# Patient Record
Sex: Female | Born: 1955 | Race: White | Hispanic: No | Marital: Married | Smoking: Current every day smoker
Health system: Southern US, Community
[De-identification: ages and names within clinical notes are randomized; demographics above are authoritative.]

## PROBLEM LIST (undated history)

## (undated) DIAGNOSIS — I10 Essential (primary) hypertension: Secondary | ICD-10-CM

## (undated) DIAGNOSIS — C801 Malignant (primary) neoplasm, unspecified: Secondary | ICD-10-CM

## (undated) HISTORY — DX: Malignant (primary) neoplasm, unspecified: C80.1

## (undated) HISTORY — DX: Essential (primary) hypertension: I10

---

## 1994-07-27 DIAGNOSIS — I10 Essential (primary) hypertension: Secondary | ICD-10-CM

## 1994-07-27 HISTORY — DX: Essential (primary) hypertension: I10

## 1995-07-28 HISTORY — PX: CHOLECYSTECTOMY: SHX55

## 1997-07-27 HISTORY — PX: INCONTINENCE SURGERY: SHX676

## 1997-07-27 HISTORY — PX: VAGINA SURGERY: SHX829

## 2004-12-02 ENCOUNTER — Ambulatory Visit: Payer: Self-pay | Admitting: Urology

## 2006-02-18 ENCOUNTER — Ambulatory Visit: Payer: Self-pay | Admitting: Unknown Physician Specialty

## 2006-07-29 ENCOUNTER — Observation Stay: Payer: Self-pay | Admitting: Internal Medicine

## 2006-07-29 ENCOUNTER — Other Ambulatory Visit: Payer: Self-pay

## 2008-04-27 ENCOUNTER — Ambulatory Visit: Payer: Self-pay | Admitting: Family Medicine

## 2009-01-02 ENCOUNTER — Ambulatory Visit: Payer: Self-pay | Admitting: Internal Medicine

## 2009-01-03 ENCOUNTER — Ambulatory Visit: Payer: Self-pay | Admitting: Internal Medicine

## 2009-01-07 ENCOUNTER — Emergency Department: Payer: Self-pay | Admitting: Emergency Medicine

## 2009-01-21 ENCOUNTER — Ambulatory Visit: Payer: Self-pay | Admitting: Family Medicine

## 2009-01-22 ENCOUNTER — Ambulatory Visit: Payer: Self-pay | Admitting: Family Medicine

## 2009-02-14 ENCOUNTER — Ambulatory Visit: Payer: Self-pay | Admitting: Gastroenterology

## 2009-04-01 ENCOUNTER — Emergency Department: Payer: Self-pay | Admitting: Emergency Medicine

## 2009-10-13 ENCOUNTER — Emergency Department: Payer: Self-pay | Admitting: Emergency Medicine

## 2010-02-06 ENCOUNTER — Ambulatory Visit: Payer: Self-pay | Admitting: Unknown Physician Specialty

## 2010-02-12 ENCOUNTER — Ambulatory Visit: Payer: Self-pay | Admitting: Unknown Physician Specialty

## 2010-03-07 ENCOUNTER — Ambulatory Visit: Payer: Self-pay | Admitting: General Surgery

## 2010-03-10 LAB — PATHOLOGY REPORT

## 2011-05-04 ENCOUNTER — Ambulatory Visit: Payer: Self-pay | Admitting: Family Medicine

## 2011-05-28 ENCOUNTER — Ambulatory Visit: Payer: Self-pay | Admitting: Family Medicine

## 2013-02-09 ENCOUNTER — Encounter: Payer: Self-pay | Admitting: *Deleted

## 2016-09-21 ENCOUNTER — Other Ambulatory Visit: Payer: Self-pay | Admitting: Family Medicine

## 2016-09-21 DIAGNOSIS — Z1231 Encounter for screening mammogram for malignant neoplasm of breast: Secondary | ICD-10-CM

## 2016-09-22 ENCOUNTER — Ambulatory Visit
Admission: RE | Admit: 2016-09-22 | Discharge: 2016-09-22 | Disposition: A | Discharge status: Home / Self Care | Payer: 59 | Source: Ambulatory Visit | Attending: Family Medicine | Admitting: Family Medicine

## 2016-09-22 ENCOUNTER — Encounter (INDEPENDENT_AMBULATORY_CARE_PROVIDER_SITE_OTHER): Payer: Self-pay

## 2016-09-22 DIAGNOSIS — Z1231 Encounter for screening mammogram for malignant neoplasm of breast: Secondary | ICD-10-CM | POA: Diagnosis not present

## 2019-02-27 ENCOUNTER — Encounter: Payer: Self-pay | Admitting: General Surgery

## 2019-04-22 ENCOUNTER — Emergency Department
Admission: EM | Admit: 2019-04-22 | Discharge: 2019-04-22 | Disposition: A | Discharge status: Home / Self Care | Payer: 59 | Attending: Emergency Medicine | Admitting: Emergency Medicine

## 2019-04-22 ENCOUNTER — Emergency Department: Payer: 59

## 2019-04-22 DIAGNOSIS — M25552 Pain in left hip: Secondary | ICD-10-CM | POA: Insufficient documentation

## 2019-04-22 DIAGNOSIS — N3000 Acute cystitis without hematuria: Secondary | ICD-10-CM

## 2019-04-22 DIAGNOSIS — M25551 Pain in right hip: Secondary | ICD-10-CM | POA: Diagnosis present

## 2019-04-22 DIAGNOSIS — F17210 Nicotine dependence, cigarettes, uncomplicated: Secondary | ICD-10-CM | POA: Diagnosis not present

## 2019-04-22 DIAGNOSIS — I10 Essential (primary) hypertension: Secondary | ICD-10-CM | POA: Diagnosis not present

## 2019-04-22 DIAGNOSIS — N3090 Cystitis, unspecified without hematuria: Secondary | ICD-10-CM | POA: Insufficient documentation

## 2019-04-22 LAB — URINALYSIS, COMPLETE (UACMP) WITH MICROSCOPIC
Bilirubin Urine: NEGATIVE
Glucose, UA: NEGATIVE mg/dL
Hgb urine dipstick: NEGATIVE
Ketones, ur: NEGATIVE mg/dL
Nitrite: NEGATIVE
Protein, ur: NEGATIVE mg/dL
Specific Gravity, Urine: 1.016 (ref 1.005–1.030)
pH: 6 (ref 5.0–8.0)

## 2019-04-22 MED ORDER — NAPROXEN 500 MG PO TABS
500.0000 mg | ORAL_TABLET | Freq: Once | ORAL | Status: AC
  Administered 2019-04-22: 07:00:00 500 mg via ORAL
  Filled 2019-04-22: qty 1

## 2019-04-22 MED ORDER — LIDOCAINE 5 % EX PTCH
1.0000 | MEDICATED_PATCH | CUTANEOUS | Status: DC
  Administered 2019-04-22: 07:00:00 1 via TRANSDERMAL
  Filled 2019-04-22: qty 1

## 2019-04-22 MED ORDER — LIDOCAINE 5 % EX PTCH: 1.0000 | MEDICATED_PATCH | Freq: Two times a day (BID) | CUTANEOUS | 0 refills | Status: AC

## 2019-04-22 MED ORDER — CEPHALEXIN 500 MG PO CAPS: 500.0000 mg | ORAL_CAPSULE | Freq: Two times a day (BID) | ORAL | 0 refills | Status: AC

## 2019-04-22 NOTE — ED Notes (Signed)
Patient transported to X-ray at this time 

## 2019-04-22 NOTE — ED Provider Notes (Signed)
8:06 AM XR imaging negative.  Will proceed with discharge as planned, given prescription for antibiotics for UTI.  Advised PCP follow-up, and given return precautions.   Lilia Pro., MD 04/22/19 [phone]

## 2019-04-22 NOTE — ED Provider Notes (Signed)
[facility] Emergency Department Provider Note   ____________________________________________   First MD Initiated Contact with Patient 04/22/19 [phone]     (approximate)  I have reviewed the triage vital signs and the nursing notes.   HISTORY  Chief Complaint Hip Pain (Bilateral)    HPI Kiara Ray is a 63 y.o. female with past medical history of hypertension who presents to the ED complaining of back and hip pain.  Patient reports she has had 3 weeks of gradually worsening pain that seems to radiate around from her lower back into her bilateral hips.  Pain is exacerbated by movement, particularly when she goes to rise from a seated to a standing position.  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  Pain continued when she woke up this morning and was not alleviated by Tylenol.  She has seen her PCP for this problem, had negative x-rays of her sacrum and was referred to orthopedics.  She denies any numbness or weakness in her lower extremities, has not had any saddle anesthesia, and denies any urinary retention/incontinence.  She has not had any fevers, abdominal pain, dysuria, hematuria, or flank pain.        Past Medical History:  Diagnosis Date  . Cancer ([place])   . Hypertension 1996    There are no active problems to display for this patient.   Past Surgical History:  Procedure Laterality Date  . CESAREAN SECTION  [id number]  . CHOLECYSTECTOMY  1997  . INCONTINENCE SURGERY  1999  . [place]    Prior to Admission medications   Medication Sig Start Date End Date Taking? Authorizing Provider  cephALEXin (KEFLEX) 500 MG capsule Take 1 capsule (500 mg total) by mouth 2 (two) times daily for 7 days. 04/22/19 04/29/19  Blake Divine, MD  lidocaine (LIDODERM) 5 % Place 1 patch onto the skin every 12 (twelve) hours. Remove &  Discard patch within 12 hours or as directed by MD 04/22/19 04/21/20  Blake Divine, MD    Allergies Patient has no known allergies.  Family History  Problem Relation Age of Onset  . Breast cancer Paternal Aunt   . Ovarian cancer Maternal Aunt   . Breast cancer Maternal Aunt     Social History Social History   Tobacco Use  . Smoking status: Current Every Day Smoker    Packs/day: 1.00  . Smokeless tobacco: Never Used  Substance Use Topics  . Alcohol use: No  . Drug use: Not on file    Review of Systems  Constitutional: No fever/chills Eyes: No visual changes. ENT: No sore throat. Cardiovascular: Denies chest pain. Respiratory: Denies shortness of breath. Gastrointestinal: No abdominal pain.  No nausea, no vomiting.  No diarrhea.  No constipation. Genitourinary: Negative for dysuria. Musculoskeletal: Positive for back pain. Skin: Negative for rash. Neurological: Negative for headaches, focal weakness or numbness.  ____________________________________________   PHYSICAL EXAM:  VITAL SIGNS: ED Triage Vitals  Enc Vitals Group     BP --      Pulse --      Resp --      Temp 04/22/19 0605 98.2 F (36.8 C)     Temp Source 04/22/19 0605 Oral     SpO2 --      Weight 04/22/19 0606 168 lb (76.2 kg)     Height 04/22/19 0606 5\' 4"  (1.626  m)     Head Circumference --      Peak Flow --      Pain Score 04/22/19 0606 8     Pain Loc --      Pain Edu? --      Excl. in [place]? --     Constitutional: Alert and oriented. Eyes: Conjunctivae are normal. Head: Atraumatic. Nose: No congestion/rhinnorhea. Mouth/Throat: Mucous membranes are moist. Neck: Normal ROM Cardiovascular: Normal rate, regular rhythm. Grossly normal heart sounds. Respiratory: Normal respiratory effort.  No retractions. Lungs CTAB. Gastrointestinal: Soft and nontender. No distention.  No CVA tenderness bilaterally. Genitourinary: deferred Musculoskeletal: No lower extremity tenderness nor edema.  No  midline thoracic or lumbar spinal tenderness. Neurologic:  Normal speech and language. No gross focal neurologic deficits are appreciated.  Strength 5 out of 5 in bilateral lower extremities. Skin:  Skin is warm, dry and intact. No rash noted. Psychiatric: Mood and affect are normal. Speech and behavior are normal.  ____________________________________________   LABS (all labs ordered are listed, but only abnormal results are displayed)  Labs Reviewed  URINALYSIS, COMPLETE (UACMP) WITH MICROSCOPIC - Abnormal; Notable for the following components:      Result Value   Color, Urine YELLOW (*)    APPearance CLOUDY (*)    Leukocytes,Ua SMALL (*)    Bacteria, UA MANY (*)    All other components within normal limits  URINE CULTURE     PROCEDURES  Procedure(s) performed (including Critical Care):  Procedures   ____________________________________________   INITIAL IMPRESSION / ASSESSMENT AND PLAN / ED COURSE       63 year old female presenting to the ED with bilateral lower back pain rating around into hips bilaterally, exacerbated when rising from seated to standing position.  She is neurovascularly intact to her bilateral lower extremities, no findings concerning for cauda equina.  She denies any traumatic injury, will screen bilateral hip x-ray as this appears to be the location of her most severe pain.  Given radiation of pain into her groin, will check UA.  No abdominal tenderness concerning for intra-abdominal pathology.  Prior imaging of her abdomen showed no evidence of aortic aneurysm.  UA consistent with infection, will start patient on Keflex.  Patient turned over to Dr. Joan Mayans pending x-ray results.      ____________________________________________   FINAL CLINICAL IMPRESSION(S) / ED DIAGNOSES  Final diagnoses:  Bilateral hip pain  Acute cystitis without hematuria     ED Discharge Orders         Ordered    cephALEXin (KEFLEX) 500 MG capsule  2 times daily      04/22/19 0647    lidocaine (LIDODERM) 5 %  Every 12 hours     04/22/19 [id number]           Note:  This document was prepared using Dragon voice recognition software and may include unintentional dictation errors.   Blake Divine, MD 04/22/19 [phone]

## 2019-04-22 NOTE — ED Triage Notes (Signed)
Patient presents to ED from home with complaints of bilateral hip pain. Has had it for a couple of weeks but today is much worse and can "barely walk." Describes pain as top of tailbone all the way around on both sides. Has an ortho appt but states she can't wait that long.

## 2019-04-24 LAB — URINE CULTURE: Culture: >=100000 — AB

## 2019-08-18 ENCOUNTER — Ambulatory Visit: Payer: BC Managed Care – PPO | Attending: Internal Medicine

## 2019-08-18 DIAGNOSIS — Z20822 Contact with and (suspected) exposure to covid-19: Secondary | ICD-10-CM | POA: Insufficient documentation

## 2019-08-19 LAB — NOVEL CORONAVIRUS, NAA: SARS-CoV-2, NAA: NOT DETECTED

## 2019-11-18 ENCOUNTER — Ambulatory Visit: Payer: Self-pay | Attending: Internal Medicine

## 2019-11-18 DIAGNOSIS — Z23 Encounter for immunization: Secondary | ICD-10-CM

## 2019-11-18 NOTE — Progress Notes (Signed)
   Covid-19 Vaccination Clinic  Name:  Kiara Ray    MRN: [id number] DOB: 1955-11-27  11/18/2019  Kiara Ray was observed post Covid-19 immunization for 30 minutes based on pre-vaccination screening without incident. She was provided with Vaccine Information Sheet and instruction to access the V-Safe system.   Kiara Ray was instructed to call 911 with any severe reactions post vaccine: Marland Kitchen Difficulty breathing  . Swelling of face and throat  . A fast heartbeat  . A bad rash all over body  . Dizziness and weakness   Immunizations Administered    Name Date Dose VIS Date Route   Pfizer COVID-19 Vaccine 11/18/2019  8:21 AM 0.3 mL 09/20/2018 Intramuscular   Manufacturer: [place]   Lot: [id number]   [place]: [id number]

## 2019-12-12 ENCOUNTER — Ambulatory Visit: Payer: Self-pay | Attending: Internal Medicine

## 2019-12-12 DIAGNOSIS — Z23 Encounter for immunization: Secondary | ICD-10-CM

## 2019-12-12 NOTE — Progress Notes (Signed)
   Covid-19 Vaccination Clinic  Name:  Kyara Bartman    MRN: [id number] DOB: [birth year]  12/12/2019  Ms. Jiles was observed post Covid-19 immunization for 15 minutes without incident. She was provided with Vaccine Information Sheet and instruction to access the V-Safe system.   Ms. Cacchione was instructed to call 911 with any severe reactions post vaccine: Marland Kitchen Difficulty breathing  . Swelling of face and throat  . A fast heartbeat  . A bad rash all over body  . Dizziness and weakness   Immunizations Administered    Name Date Dose VIS Date Route   Pfizer COVID-19 Vaccine 12/12/2019  3:13 PM 0.3 mL 09/20/2018 Intramuscular   Manufacturer: [place]   Lot: [id number]   [place]: [id number]

## 2020-09-28 IMAGING — CR DG HIP (WITH OR WITHOUT PELVIS) 2-3V*R*
1 series · 3 of 3 positions shown · non-contrast
Comparison: None.

CLINICAL DATA: Bilateral hip pain.

EXAM:
DG HIP (WITH OR WITHOUT PELVIS) 2-3V RIGHT

[Series 1: dg hip unilat w or w/o pelvis 2-3 views  · non-contrast · 0.14mm/px · 3 of 3 slices shown]
[im 1/3]
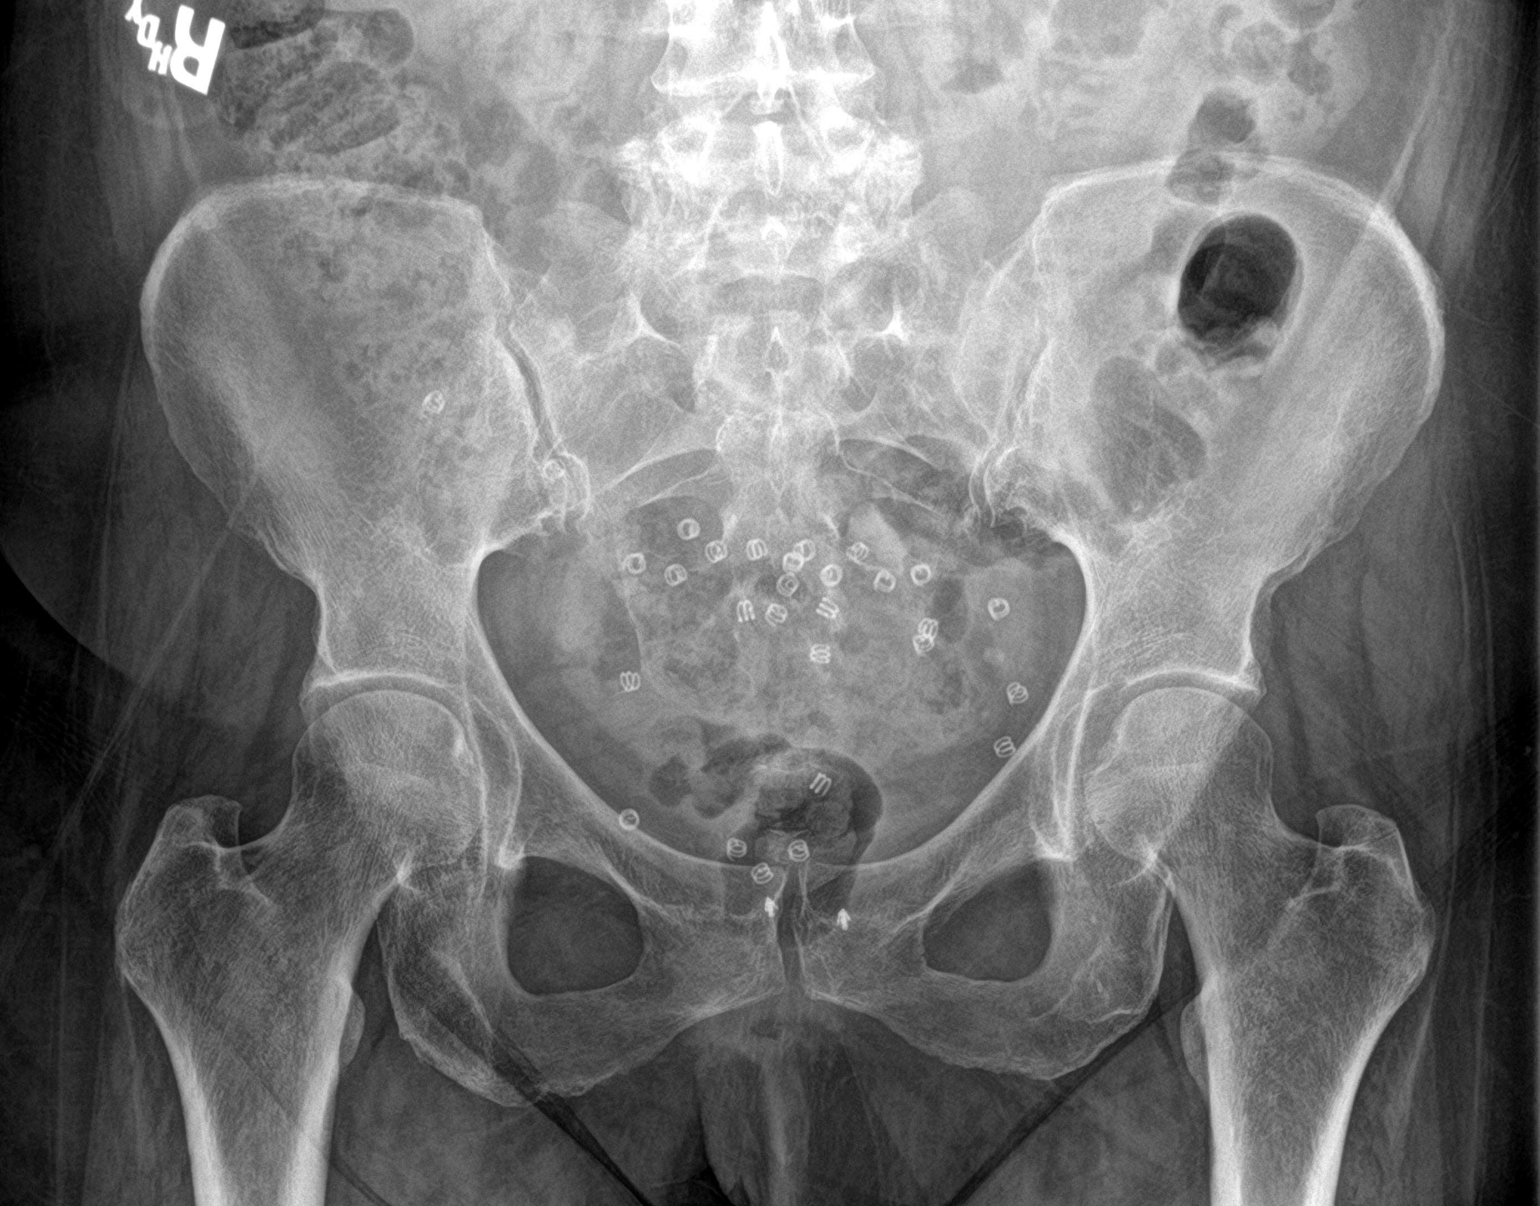
[im 2/3]
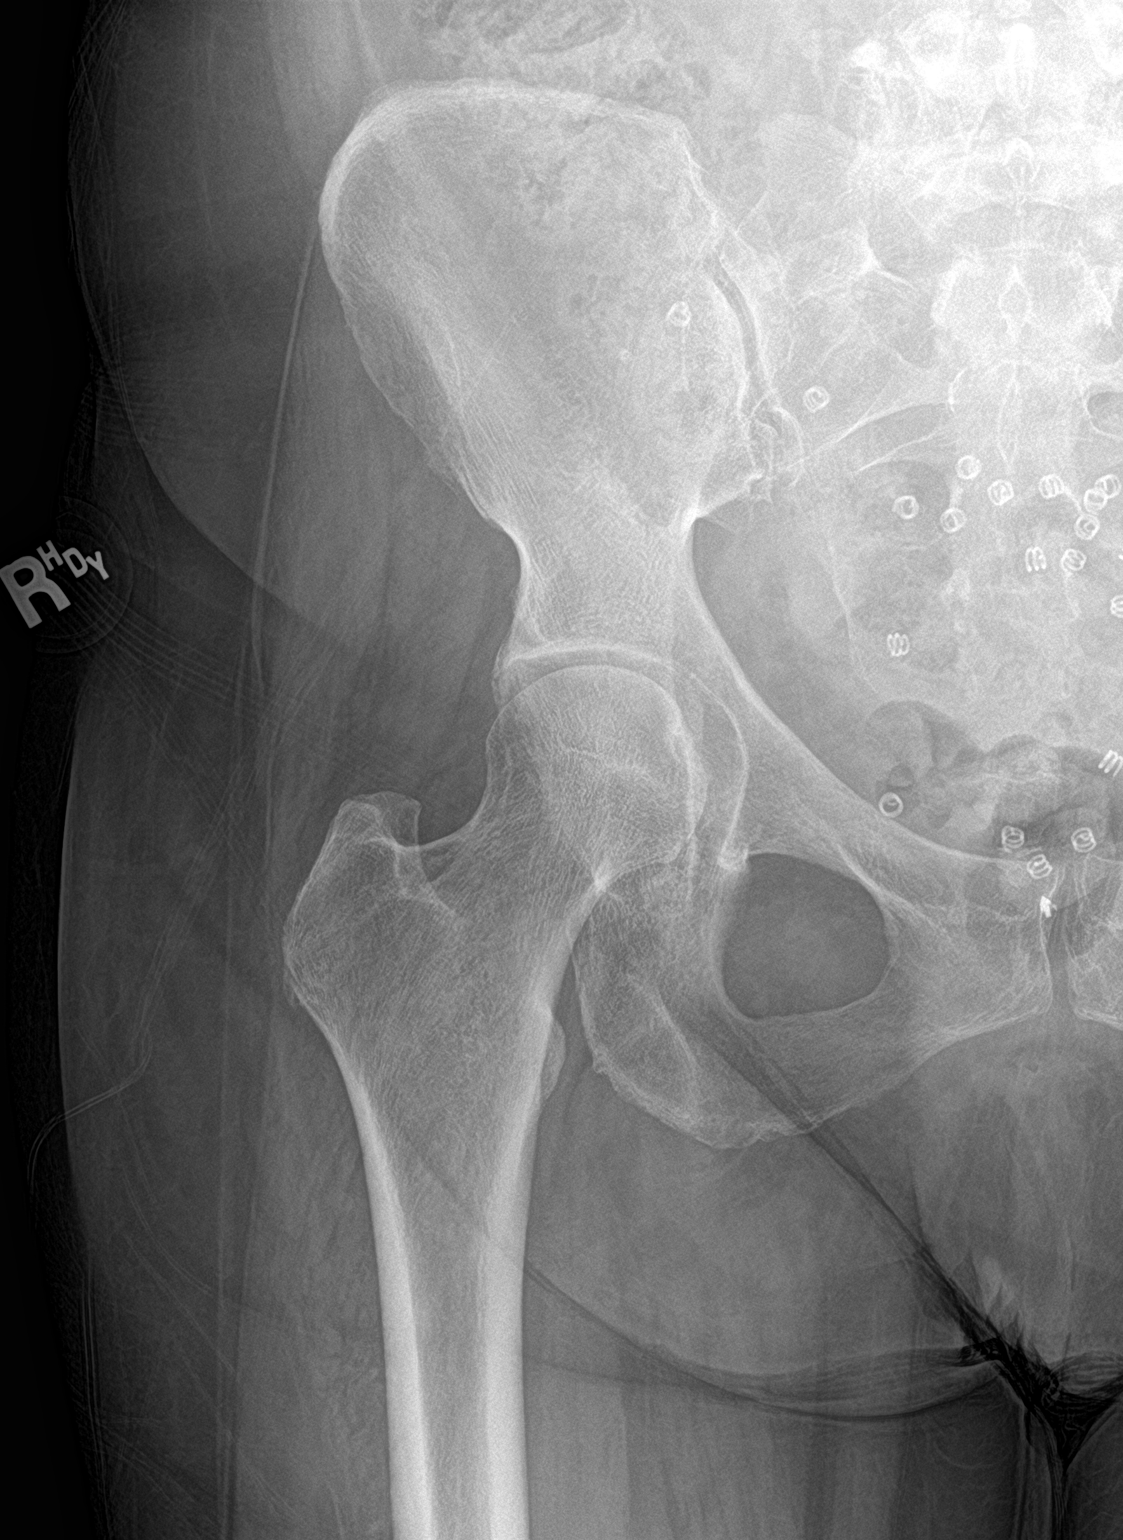
[im 3/3]
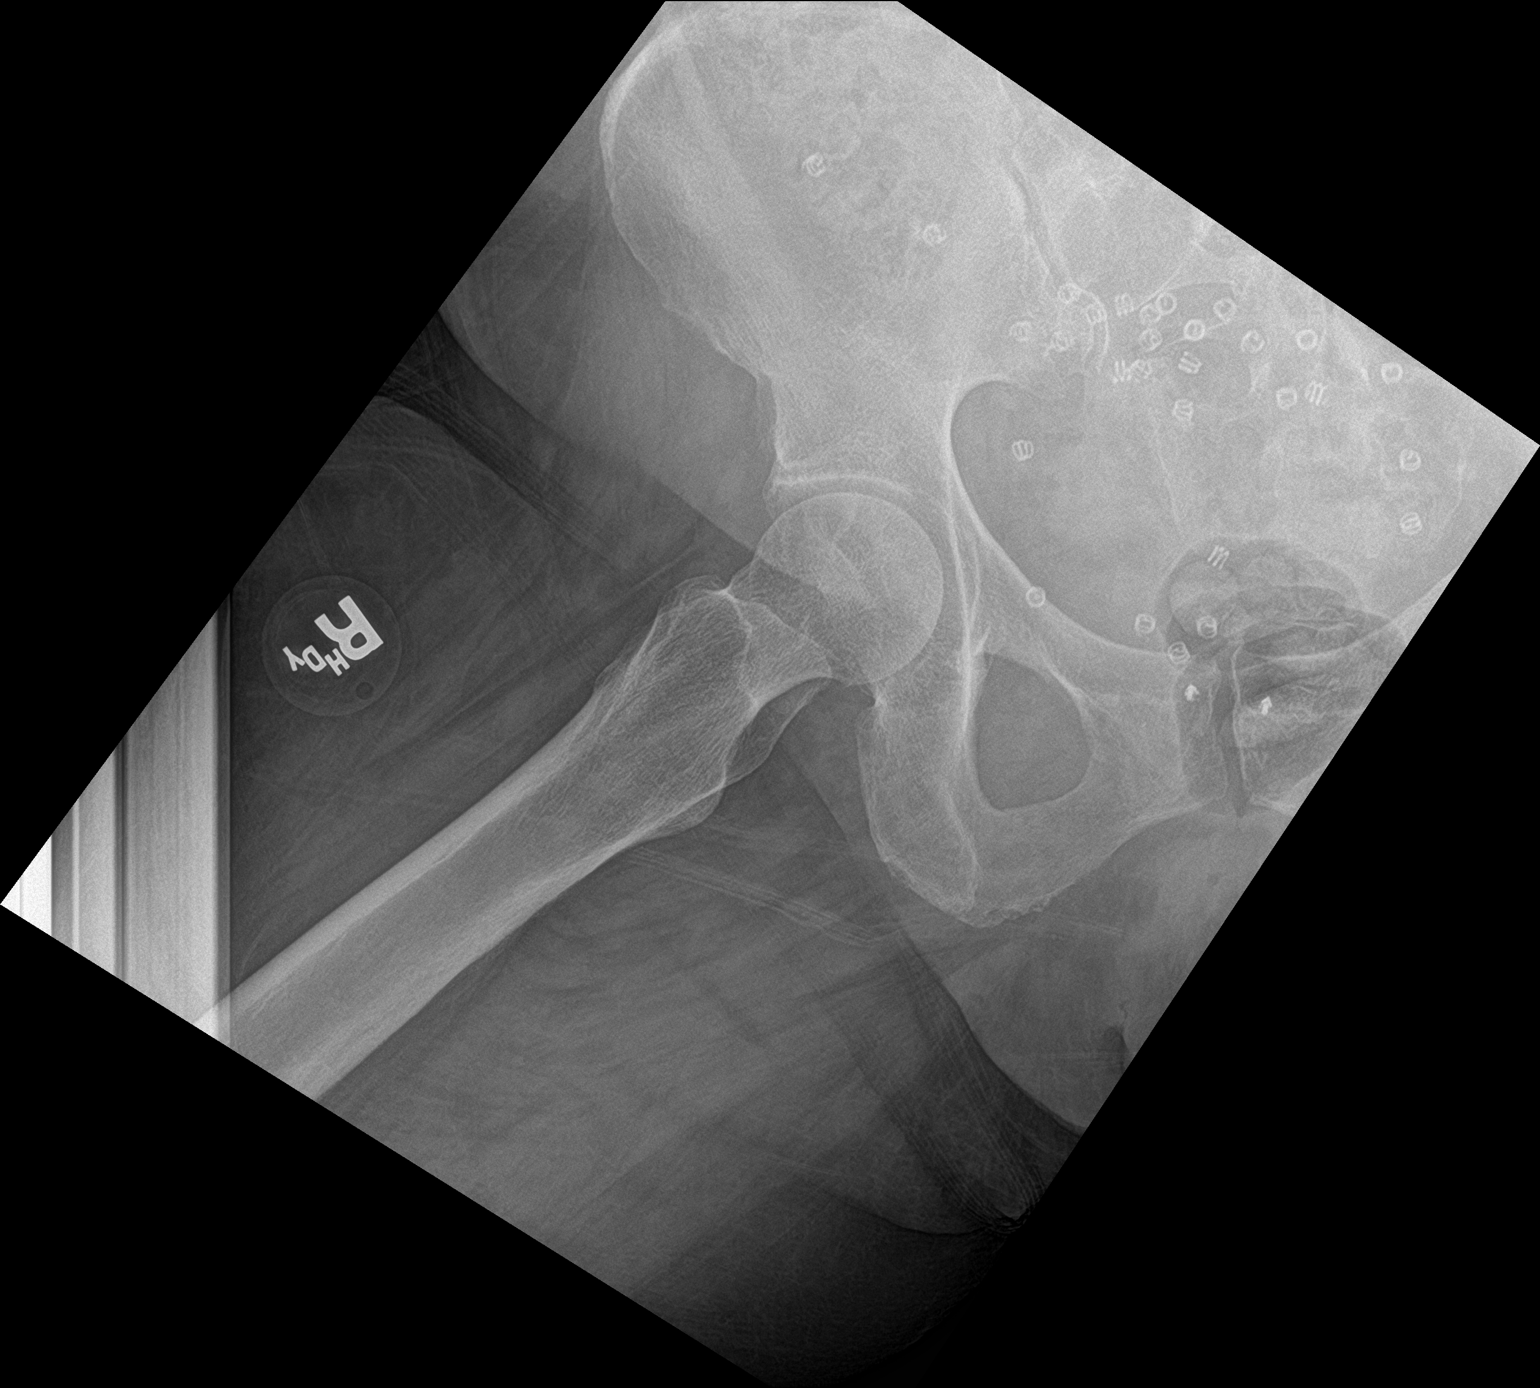

[3 of 3 positions shown; findings below may reference images not displayed]

FINDINGS: There is no evidence of hip fracture or dislocation. There is no
evidence of arthropathy or other focal bone abnormality. Mesh
anchors project over the pelvis.
IMPRESSION: Negative.

## 2020-09-28 IMAGING — CR DG HIP (WITH OR WITHOUT PELVIS) 2-3V*L*
1 series · 3 of 3 positions shown · non-contrast
Comparison: None.

CLINICAL DATA: Hip pain.

EXAM:
DG HIP (WITH OR WITHOUT PELVIS) 2-3V LEFT

[Series 1: dg hip unilat w or w/o pelvis 2-3 views  · non-contrast · 0.14mm/px · 3 of 3 slices shown]
[im 1/3]
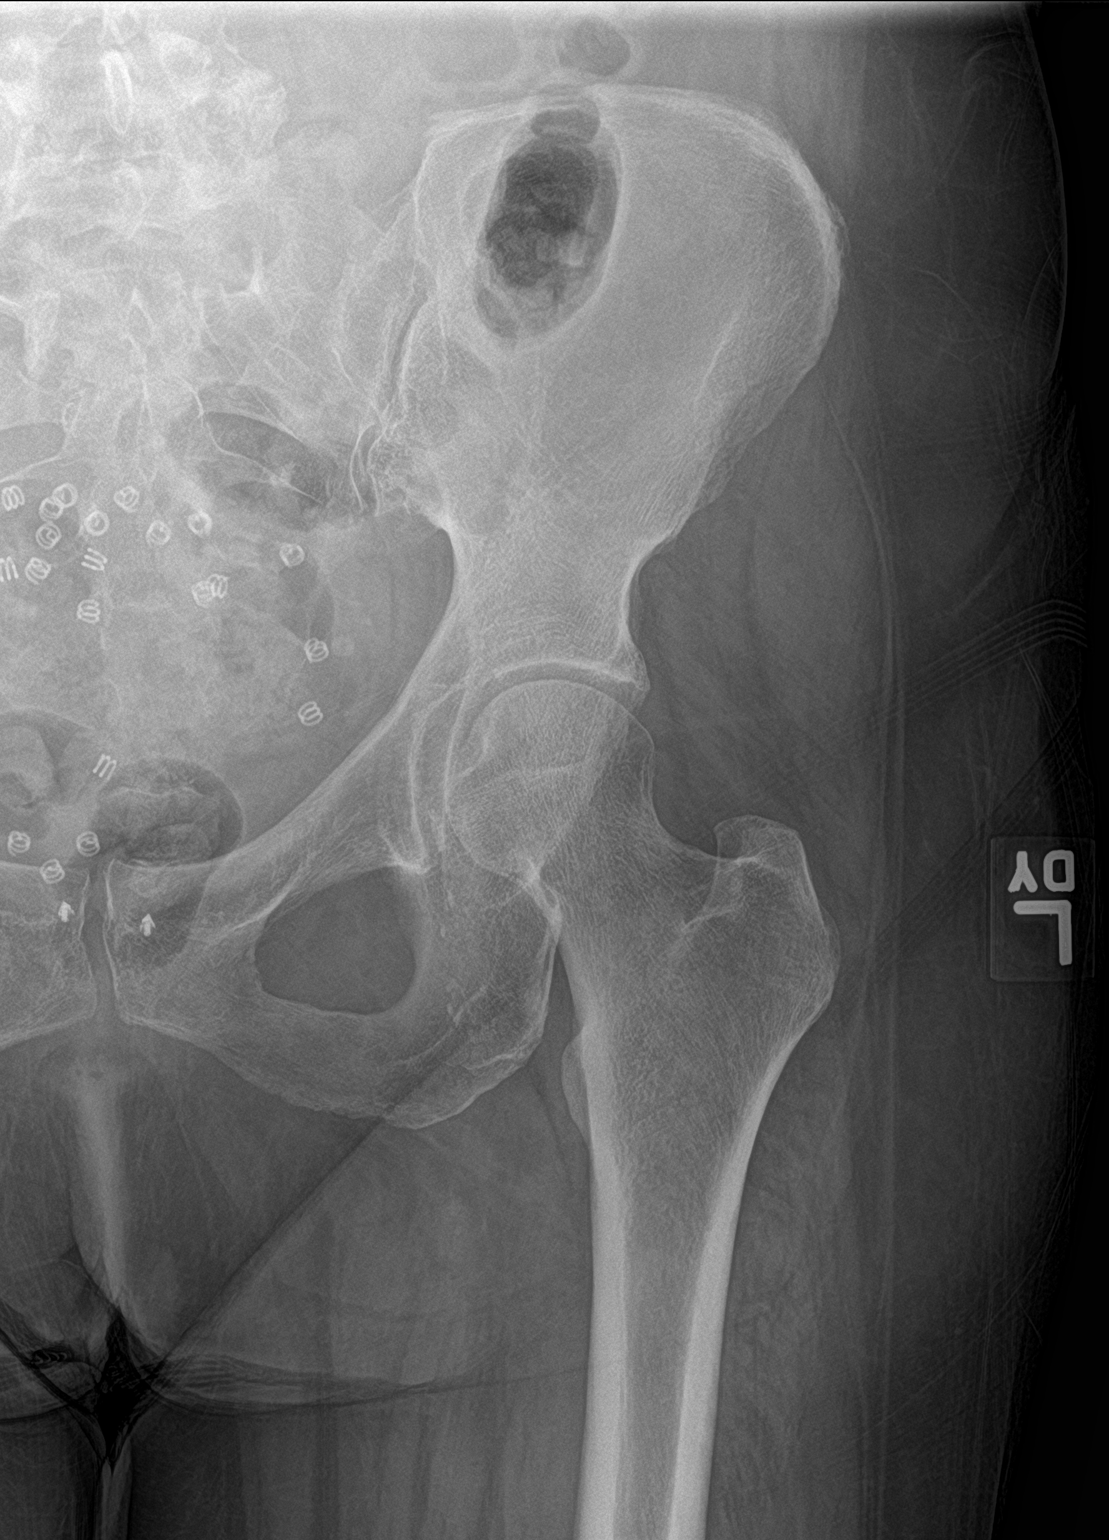
[im 2/3]
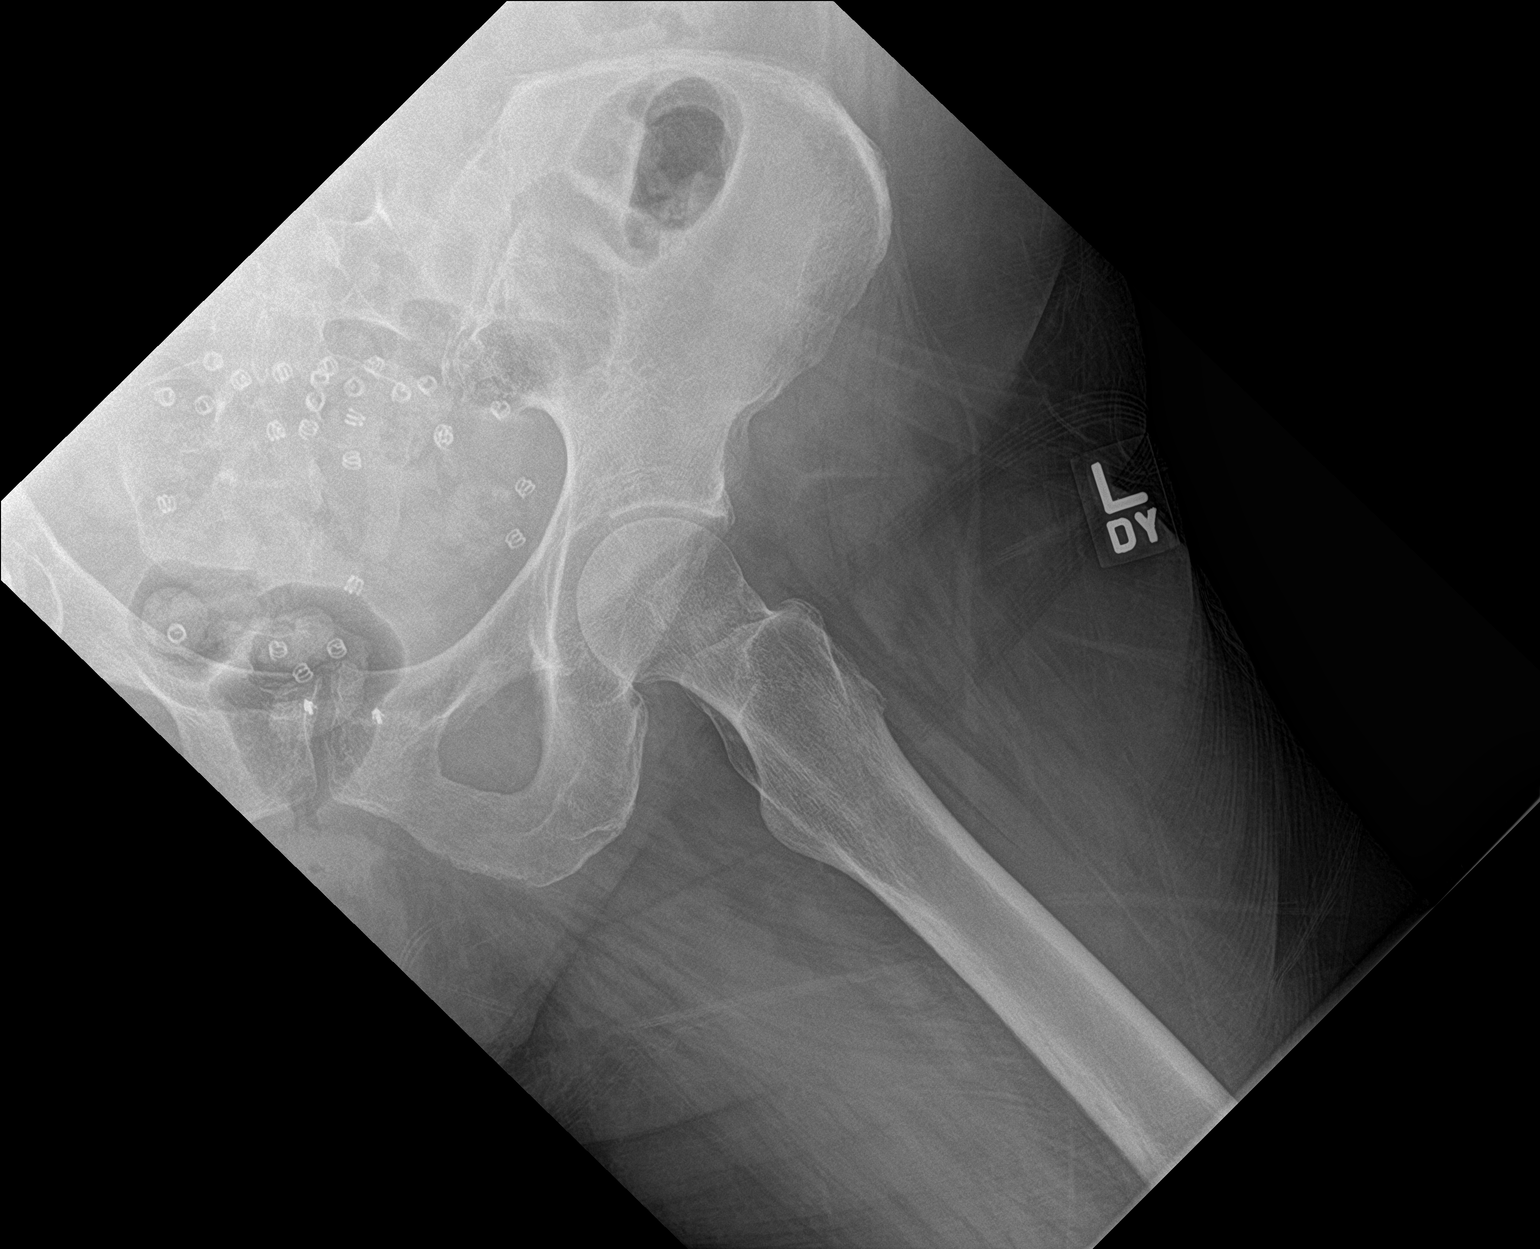
[im 3/3]
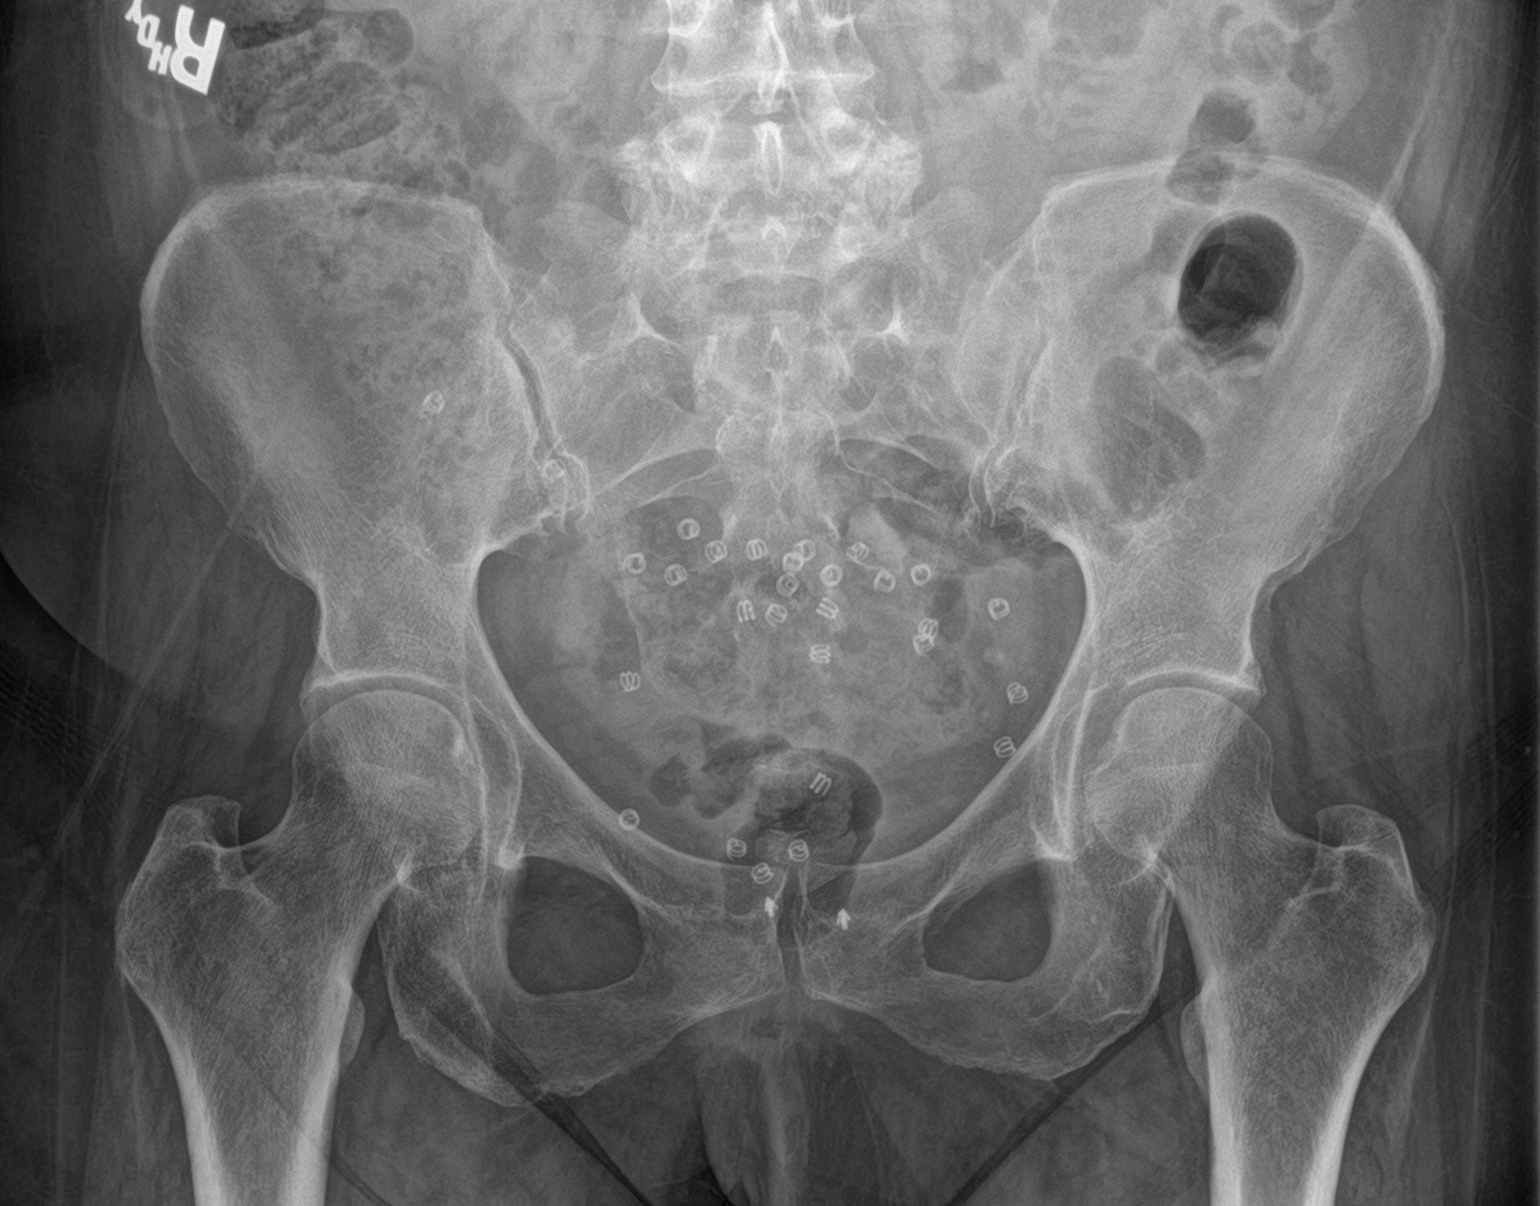

[3 of 3 positions shown; findings below may reference images not displayed]

FINDINGS: There is no evidence of hip fracture or dislocation. There is no
evidence of arthropathy or other focal bone abnormality. Mesh
anchors are seen over the pelvis.
IMPRESSION: Negative.

## 2022-03-16 ENCOUNTER — Ambulatory Visit: Payer: Self-pay

## 2022-03-16 ENCOUNTER — Ambulatory Visit
Admission: EM | Admit: 2022-03-16 | Discharge: 2022-03-16 | Disposition: A | Discharge status: Home / Self Care | Payer: BC Managed Care – PPO | Attending: Emergency Medicine | Admitting: Emergency Medicine

## 2022-03-16 DIAGNOSIS — N390 Urinary tract infection, site not specified: Secondary | ICD-10-CM

## 2022-03-16 LAB — URINALYSIS, ROUTINE W REFLEX MICROSCOPIC

## 2022-03-16 LAB — URINALYSIS, MICROSCOPIC (REFLEX)

## 2022-03-16 MED ORDER — PHENAZOPYRIDINE HCL 200 MG PO TABS: 200.0000 mg | ORAL_TABLET | Freq: Three times a day (TID) | ORAL | 0 refills | Status: AC

## 2022-03-16 MED ORDER — CEPHALEXIN 500 MG PO CAPS: 500.0000 mg | ORAL_CAPSULE | Freq: Two times a day (BID) | ORAL | 0 refills | Status: AC

## 2022-03-16 NOTE — ED Triage Notes (Signed)
Patient c/o frequent urination and burning with urination -- started Friday.   Patient has been taken AZO with no help.

## 2022-03-16 NOTE — ED Provider Notes (Signed)
[facility]    CSN: [id number] Arrival date & time: 03/16/22  1710      History   Chief Complaint Chief Complaint  Patient presents with   Urinary Frequency   Dysuria    HPI Gal Smolinski is a 66 y.o. female.   HPI  66 year old female here for evaluation of urinary complaints.  Patient reports that for last 3 days she has had burning with urination, urinary urgency and frequency, and also increase in nocturia.  She denies seeing any blood in her urine but states that she is also been using Azo since the onset of symptoms.  Also denies cloudiness, fever, low back pain, abdominal pain, nausea, or vomiting.  Past Medical History:  Diagnosis Date   Cancer [facility])    Hypertension 1996    There are no problems to display for this patient.   Past Surgical History:  Procedure Laterality Date   CESAREAN SECTION  [id number]   [place]   VAGINA SURGERY  1999    OB History   No obstetric history on file.      Home Medications    Prior to Admission medications   Medication Sig Start Date End Date Taking? Authorizing Provider  cephALEXin (KEFLEX) 500 MG capsule Take 1 capsule (500 mg total) by mouth 2 (two) times daily for 5 days. 03/16/22 03/21/22 Yes Margarette Canada, NP  phenazopyridine (PYRIDIUM) 200 MG tablet Take 1 tablet (200 mg total) by mouth 3 (three) times daily. 03/16/22  Yes Margarette Canada, NP  buPROPion [facility] SR) 150 MG 12 hr tablet Take by mouth.    [provider]  metFORMIN (GLUCOPHAGE) 500 MG tablet Take 500 mg by mouth 2 (two) times daily. 02/17/22   [provider]  metoprolol tartrate (LOPRESSOR) 25 MG tablet Take 25 mg by mouth 2 (two) times daily. 02/24/22   [provider]  pravastatin (PRAVACHOL) 80 MG tablet Take 80 mg by mouth daily. 02/17/22   [provider]    Family History Family History  Problem Relation Age of Onset   Breast cancer Paternal Aunt     Ovarian cancer Maternal Aunt    Breast cancer Maternal Aunt     Social History Social History   Tobacco Use   Smoking status: Every Day    Packs/day: 1.00    Types: Cigarettes   Smokeless tobacco: Never  Substance Use Topics   Alcohol use: No     Allergies   Patient has no known allergies.   Review of Systems Review of Systems  Constitutional:  Negative for fever.  Gastrointestinal:  Negative for abdominal pain.  Genitourinary:  Positive for dysuria, frequency and urgency. Negative for hematuria.  Musculoskeletal:  Negative for back pain.  Hematological: Negative.   Psychiatric/Behavioral: Negative.       Physical Exam Triage Vital Signs ED Triage Vitals  Enc Vitals Group     BP 03/16/22 1752 (!) 155/75     Pulse Rate 03/16/22 1752 66     Resp --      Temp 03/16/22 1752 98.4 F (36.9 C)     Temp Source 03/16/22 1752 Oral     SpO2 03/16/22 1752 96 %     Weight 03/16/22 1748 167 lb (75.8 kg)     Height 03/16/22 1748 '5\' 5"'$  (1.651 m)     Head Circumference --      Peak Flow --      Pain Score 03/16/22  1748 8     Pain Loc --      Pain Edu? --      Excl. in [place]? --    No data found.  Updated Vital Signs BP (!) 155/75 (BP Location: Left Arm)   Pulse 66   Temp 98.4 F (36.9 C) (Oral)   Ht '5\' 5"'$  (1.651 m)   Wt 167 lb (75.8 kg)   SpO2 96%   BMI 27.79 kg/m   Visual Acuity Right Eye Distance:   Left Eye Distance:   Bilateral Distance:    Right Eye Near:   Left Eye Near:    Bilateral Near:     Physical Exam Vitals and nursing note reviewed.  Constitutional:      Appearance: Normal appearance. She is not ill-appearing.  HENT:     Head: Normocephalic and atraumatic.  Cardiovascular:     Rate and Rhythm: Normal rate and regular rhythm.     Pulses: Normal pulses.     Heart sounds: Normal heart sounds. No murmur heard.    No friction rub. No gallop.  Pulmonary:     Effort: Pulmonary effort is normal.     Breath sounds: Normal breath sounds. No  wheezing, rhonchi or rales.  Abdominal:     General: Abdomen is flat.     Palpations: Abdomen is soft.     Tenderness: There is no abdominal tenderness. There is no right CVA tenderness or left CVA tenderness.  Skin:    General: Skin is warm and dry.     Capillary Refill: Capillary refill takes less than 2 seconds.  Neurological:     General: No focal deficit present.     Mental Status: She is alert and oriented to person, place, and time.  Psychiatric:        Mood and Affect: Mood normal.        Behavior: Behavior normal.        Thought Content: Thought content normal.        Judgment: Judgment normal.      UC Treatments / Results  Labs (all labs ordered are listed, but only abnormal results are displayed) Labs Reviewed  URINALYSIS, ROUTINE W REFLEX MICROSCOPIC - Abnormal; Notable for the following components:      Result Value   Color, Urine ORANGE (*)    APPearance HAZY (*)    Glucose, UA   (*)    Value: TEST NOT REPORTED DUE TO COLOR INTERFERENCE OF URINE PIGMENT   Hgb urine dipstick   (*)    Value: TEST NOT REPORTED DUE TO COLOR INTERFERENCE OF URINE PIGMENT   Bilirubin Urine   (*)    Value: TEST NOT REPORTED DUE TO COLOR INTERFERENCE OF URINE PIGMENT   Ketones, ur   (*)    Value: TEST NOT REPORTED DUE TO COLOR INTERFERENCE OF URINE PIGMENT   Protein, ur   (*)    Value: TEST NOT REPORTED DUE TO COLOR INTERFERENCE OF URINE PIGMENT   Nitrite   (*)    Value: TEST NOT REPORTED DUE TO COLOR INTERFERENCE OF URINE PIGMENT   Leukocytes,Ua   (*)    Value: TEST NOT REPORTED DUE TO COLOR INTERFERENCE OF URINE PIGMENT   All other components within normal limits  URINALYSIS, MICROSCOPIC (REFLEX) - Abnormal; Notable for the following components:   Bacteria, UA MANY (*)    All other components within normal limits  URINE CULTURE    EKG   Radiology No results found.  Procedures Procedures (  including critical care time)  Medications Ordered in UC Medications - No data  to display  Initial Impression / Assessment and Plan / UC Course  I have reviewed the triage vital signs and the nursing notes.  Pertinent labs & imaging results that were available during my care of the patient were reviewed by me and considered in my medical decision making (see chart for details).   Patient is a nontoxic-appearing 66 year old female here for evaluation of urinary complaints outlined HPI above.  Physical exam reveals S1-S2 heart sounds with regular rate and rhythm and lung sounds are clear to auscultation all fields.  No CVA tenderness on exam.  Abdomen soft, flat, and nontender.  Will order urinalysis.  Urinalysis is orange in color with a hazy appearance.  Majority the dip was not performed secondary to colorimetric interference.  The reflex microscopy shows 11-20 RBCs, too numerous to count WBCs, many bacteria, and 6-10 squamous epithelials.  I will send urine for culture.  I will discharge patient home with a diagnosis of UTI and treat her with Keflex 500 mg twice daily for 5 days.  I will also offer the patient Pyridium that she can use for urinary discomfort.   Final Clinical Impressions(s) / UC Diagnoses   Final diagnoses:  Lower urinary tract infectious disease     Discharge Instructions      Take the Keflex twice daily for 5 days with food for treatment of urinary tract infection.  Use the Pyridium every 8 hours as needed for urinary discomfort.  This will turn your urine a bright red-orange.  Increase your oral fluid intake so that you increase your urine production and or flushing your urinary system.  Take an over-the-counter probiotic, such as Culturelle-Align-Activia, 1 hour after each dose of antibiotic to prevent diarrhea or yeast infections from forming.  We will culture urine and change the antibiotics if necessary.  Return for reevaluation, or see your primary care provider, for any new or worsening symptoms.      ED Prescriptions      Medication Sig Dispense Auth. Provider   cephALEXin (KEFLEX) 500 MG capsule Take 1 capsule (500 mg total) by mouth 2 (two) times daily for 5 days. 10 capsule Margarette Canada, NP   phenazopyridine (PYRIDIUM) 200 MG tablet Take 1 tablet (200 mg total) by mouth 3 (three) times daily. 6 tablet Margarette Canada, NP      PDMP not reviewed this encounter.   Margarette Canada, NP 03/16/22 1820

## 2022-03-16 NOTE — Discharge Instructions (Addendum)
Take the Keflex twice daily for 5 days with food for treatment of urinary tract infection.  Use the Pyridium every 8 hours as needed for urinary discomfort.  This will turn your urine a bright red-orange.  Increase your oral fluid intake so that you increase your urine production and or flushing your urinary system.  Take an over-the-counter probiotic, such as Culturelle-Align-Activia, 1 hour after each dose of antibiotic to prevent diarrhea or yeast infections from forming.  We will culture urine and change the antibiotics if necessary.  Return for reevaluation, or see your primary care provider, for any new or worsening symptoms.

## 2022-03-18 LAB — URINE CULTURE: Culture: >=100000 — AB

## 2024-05-22 ENCOUNTER — Encounter: Payer: Self-pay | Admitting: Emergency Medicine

## 2024-05-22 ENCOUNTER — Ambulatory Visit: Admission: EM | Admit: 2024-05-22 | Discharge: 2024-05-22 | Disposition: A | Discharge status: Home / Self Care

## 2024-05-22 DIAGNOSIS — B372 Candidiasis of skin and nail: Secondary | ICD-10-CM

## 2024-05-22 MED ORDER — FLUCONAZOLE 150 MG PO TABS: 150.0000 mg | ORAL_TABLET | ORAL | 0 refills | Status: AC

## 2024-05-22 MED ORDER — CLOBETASOL PROPIONATE 0.05 % EX OINT: 1.0000 | TOPICAL_OINTMENT | Freq: Two times a day (BID) | CUTANEOUS | 0 refills | Status: AC

## 2024-05-22 NOTE — ED Provider Notes (Signed)
 [facility]    CSN: [id number] Arrival date & time: 05/22/24  1651      History   Chief Complaint Chief Complaint  Patient presents with   Rash    HPI Kiara Ray is a 68 y.o. female.   HPI  68 year old female with past medical history significant for hypertension presents for evaluation of an itching burning rash that has been on the inside of both thighs for the last 2 weeks and recently started underneath her left breast.  She has been applying calamine lotion and cornstarch which has not been helping.  She reports that when she applied the cornstarch this morning the rash flared and became worse.  Past Medical History:  Diagnosis Date   Cancer [facility])    Hypertension 1996    There are no active problems to display for this patient.   Past Surgical History:  Procedure Laterality Date   CESAREAN SECTION  8014,8009   CHOLECYSTECTOMY  1997   INCONTINENCE SURGERY  1999   VAGINA SURGERY  1999    OB History   No obstetric history on file.      Home Medications    Prior to Admission medications   Medication Sig Start Date End Date Taking? Authorizing Provider  albuterol (VENTOLIN HFA) 108 (90 Base) MCG/ACT inhaler Rescue  Take 2 puffs every 4 hours as needed 02/15/22  Yes [provider]  aspirin EC 81 MG tablet Take 81 mg by mouth. 01/06/24  Yes [provider]  BREYNA 80-4.5 MCG/ACT inhaler Inhale 2 puffs into the lungs 2 (two) times daily. 02/22/24  Yes [provider]  buPROPion (WELLBUTRIN SR) 150 MG 12 hr tablet Take by mouth.   Yes [provider]  clobetasol ointment (TEMOVATE) 0.05 % Apply 1 Application topically 2 (two) times daily. 05/22/24  Yes Bernardino Ditch, NP  fluconazole (DIFLUCAN) 150 MG tablet Take 1 tablet (150 mg total) by mouth once a week for 4 doses. 05/22/24 06/13/24 Yes Bernardino Ditch, NP  metFORMIN (GLUCOPHAGE) 500 MG tablet Take 500 mg by mouth 2 (two) times daily. 02/17/22  Yes [provider]  metoprolol tartrate (LOPRESSOR) 25 MG tablet Take 25 mg by mouth 2 (two) times daily. 02/24/22  Yes [provider]  nitroGLYCERIN (NITROSTAT) 0.4 MG SL tablet Place 0.4 mg under the tongue. 01/06/24  Yes [provider]  pravastatin (PRAVACHOL) 80 MG tablet Take 80 mg by mouth daily. 02/17/22  Yes [provider]  famotidine (PEPCID) 20 MG tablet Take 20 mg by mouth.    [provider]  isosorbide mononitrate (IMDUR) 30 MG 24 hr tablet Take 30 mg by mouth daily.    [provider]  oxybutynin (DITROPAN-XL) 5 MG 24 hr tablet Take 5 mg by mouth daily.    [provider]  phenazopyridine  (PYRIDIUM ) 200 MG tablet Take 1 tablet (200 mg total) by mouth 3 (three) times daily. 03/16/22   Bernardino Ditch, NP    Family History Family History  Problem Relation Age of Onset   Breast cancer Paternal Aunt    Ovarian cancer Maternal Aunt    Breast cancer Maternal Aunt     Social History Social History   Tobacco Use   Smoking status: Every Day    Current packs/day: 1.00    Types: Cigarettes   Smokeless tobacco: Never  Vaping Use   Vaping status: Never Used  Substance Use Topics   Alcohol use: No   Drug use: Never  Allergies   Other   Review of Systems Review of Systems  Constitutional:  Negative for fever.  Skin:  Positive for color change and rash.     Physical Exam Triage Vital Signs ED Triage Vitals  Encounter Vitals Group     BP      Girls Systolic BP Percentile      Girls Diastolic BP Percentile      Boys Systolic BP Percentile      Boys Diastolic BP Percentile      Pulse      Resp      Temp      Temp src      SpO2      Weight      Height      Head Circumference      Peak Flow      Pain Score      Pain Loc      Pain Education      Exclude from Growth Chart    No data found.  Updated Vital Signs BP (!) 170/74 (BP Location: Right Arm)   Pulse (!) 59   Temp 97.9 F (36.6 C) (Oral)   Resp 16    SpO2 98%   Visual Acuity Right Eye Distance:   Left Eye Distance:   Bilateral Distance:    Right Eye Near:   Left Eye Near:    Bilateral Near:     Physical Exam Vitals and nursing note reviewed.  Constitutional:      Appearance: Normal appearance. She is not ill-appearing.  HENT:     Head: Normocephalic and atraumatic.  Skin:    General: Skin is warm and dry.     Capillary Refill: Capillary refill takes less than 2 seconds.     Findings: Erythema and rash present.  Neurological:     General: No focal deficit present.     Mental Status: She is alert and oriented to person, place, and time.      UC Treatments / Results  Labs (all labs ordered are listed, but only abnormal results are displayed) Labs Reviewed - No data to display  EKG   Radiology No results found.  Procedures Procedures (including critical care time)  Medications Ordered in UC Medications - No data to display  Initial Impression / Assessment and Plan / UC Course  I have reviewed the triage vital signs and the nursing notes.  Pertinent labs & imaging results that were available during my care of the patient were reviewed by me and considered in my medical decision making (see chart for details).   Patient is a pleasant, nontoxic-appearing 68 year old female presenting for evaluation of a skin rash that has been present for the last several weeks.  Patient seen image above, the rash underneath the patient's left breast has an erythematous base and a greasy coating.  It is in the skin fold.  She reports that the rash on her inner thighs appears to be the same.  This rash is consistent with a skin yeast infection.  I will discharge her home on Diflucan 150 mg tablets and have her take 1 tablet weekly for the next 4 weeks.  I will also prescribe her topical clobetasol cream that she can apply twice daily until the rash is gone and then for 3 additional days.   Final Clinical Impressions(s) / UC  Diagnoses   Final diagnoses:  Skin yeast infection     Discharge Instructions      As we  discussed, you have a skin yeast infection.  Take 1 Diflucan tablet weekly for the next 4 weeks.  Apply the clobetasol ointment twice daily to the rash on your inner thighs underneath your left breast until the rash has resolved and then for 3 additional days.  Keep the areas with the rash is present open to air is much as possible.  Make sure that you are drying the areas thoroughly with a hair dryer following a shower to prevent moisture accumulation and worsening of the yeast infection.  If your symptoms not improving, or new symptoms develop, either return for reevaluation or follow-up with your primary care provider.     ED Prescriptions     Medication Sig Dispense Auth. Provider   fluconazole (DIFLUCAN) 150 MG tablet Take 1 tablet (150 mg total) by mouth once a week for 4 doses. 4 tablet Bernardino Ditch, NP   clobetasol ointment (TEMOVATE) 0.05 % Apply 1 Application topically 2 (two) times daily. 60 g Bernardino Ditch, NP      PDMP not reviewed this encounter.   Bernardino Ditch, NP 05/22/24 1735

## 2024-05-22 NOTE — Discharge Instructions (Addendum)
 As we discussed, you have a skin yeast infection.  Take 1 Diflucan tablet weekly for the next 4 weeks.  Apply the clobetasol ointment twice daily to the rash on your inner thighs underneath your left breast until the rash has resolved and then for 3 additional days.  Keep the areas with the rash is present open to air is much as possible.  Make sure that you are drying the areas thoroughly with a hair dryer following a shower to prevent moisture accumulation and worsening of the yeast infection.  If your symptoms not improving, or new symptoms develop, either return for reevaluation or follow-up with your primary care provider.

## 2024-05-22 NOTE — ED Triage Notes (Signed)
 Pt c/o a rash/ blisters in her inner thighs and under her breast for several week. She applied ointment and cornstarch tp the are and it became worse.
